# Patient Record
Sex: Male | Born: 1995 | Race: White | Hispanic: No | Marital: Single | Smoking: Current every day smoker
Health system: Southern US, Community
[De-identification: ages and names within clinical notes are randomized; demographics above are authoritative.]

## PROBLEM LIST (undated history)

## (undated) DIAGNOSIS — B1920 Unspecified viral hepatitis C without hepatic coma: Secondary | ICD-10-CM

## (undated) DIAGNOSIS — F3111 Bipolar disorder, current episode manic without psychotic features, mild: Secondary | ICD-10-CM

## (undated) DIAGNOSIS — J45909 Unspecified asthma, uncomplicated: Secondary | ICD-10-CM

## (undated) HISTORY — PX: TONSILLECTOMY AND ADENOIDECTOMY: SUR1326

## (undated) HISTORY — DX: Bipolar disorder, current episode manic without psychotic features, mild: F31.11

## (undated) HISTORY — DX: Unspecified viral hepatitis C without hepatic coma: B19.20

---

## 2018-12-31 ENCOUNTER — Emergency Department (HOSPITAL_COMMUNITY)
Admission: EM | Admit: 2018-12-31 | Discharge: 2018-12-31 | Disposition: A | Discharge status: Home / Self Care | Payer: Self-pay | Attending: Emergency Medicine | Admitting: Emergency Medicine

## 2018-12-31 ENCOUNTER — Other Ambulatory Visit: Payer: Self-pay

## 2018-12-31 ENCOUNTER — Encounter (HOSPITAL_COMMUNITY): Payer: Self-pay

## 2018-12-31 DIAGNOSIS — J45901 Unspecified asthma with (acute) exacerbation: Secondary | ICD-10-CM | POA: Insufficient documentation

## 2018-12-31 HISTORY — DX: Unspecified asthma, uncomplicated: J45.909

## 2018-12-31 MED ORDER — AEROCHAMBER Z-STAT PLUS/MEDIUM MISC
1.0000 | Freq: Once | Status: DC
  Filled 2018-12-31: qty 1

## 2018-12-31 MED ORDER — METHYLPREDNISOLONE SODIUM SUCC 125 MG IJ SOLR
125.0000 mg | Freq: Once | INTRAMUSCULAR | Status: AC
  Administered 2018-12-31: 125 mg via INTRAVENOUS
  Filled 2018-12-31: qty 2

## 2018-12-31 MED ORDER — MAGNESIUM SULFATE 2 GM/50ML IV SOLN
2.0000 g | INTRAVENOUS | Status: AC
  Administered 2018-12-31: 06:00:00 2 g via INTRAVENOUS
  Filled 2018-12-31: qty 50

## 2018-12-31 MED ORDER — PREDNISONE 20 MG PO TABS: 40.0000 mg | ORAL_TABLET | Freq: Every day | ORAL | 0 refills | Status: DC

## 2018-12-31 MED ORDER — ALBUTEROL SULFATE HFA 108 (90 BASE) MCG/ACT IN AERS
8.0000 | INHALATION_SPRAY | Freq: Once | RESPIRATORY_TRACT | Status: AC
  Administered 2018-12-31: 8 via RESPIRATORY_TRACT
  Filled 2018-12-31: qty 6.7

## 2018-12-31 MED ORDER — ALBUTEROL SULFATE HFA 108 (90 BASE) MCG/ACT IN AERS
8.0000 | INHALATION_SPRAY | Freq: Once | RESPIRATORY_TRACT | Status: AC
  Administered 2018-12-31: 8 via RESPIRATORY_TRACT

## 2018-12-31 NOTE — ED Triage Notes (Signed)
Pt coming in c/o onset of shortness of breath after being around cats tonight. Pt is allergic. Hx of asthma. Used inhaler multiple times without relief.

## 2018-12-31 NOTE — ED Notes (Signed)
Pt ambulated roughly 200 ft with out assistance. Pt O2 saturation remained above 95% with out any complaint or noticeable distress from pt.

## 2018-12-31 NOTE — ED Provider Notes (Signed)
[place] DEPT Provider Note   CSN: [id number] Arrival date & time: 12/31/18  0200     History Chief Complaint  Patient presents with  . Asthma    Carlos Schmidt is a 23 y.o. male.  Patient presents to the emergency department with a chief complaint of asthma exacerbation.  He states that he has a history of asthma.  He was exposed to a cat today, which he is very allergic to.  He denies any fevers, chills, cough.  He has tried using his inhaler with little relief.  Denies any other associated symptoms.  The history is provided by the patient. No language interpreter was used.       Past Medical History:  Diagnosis Date  . Asthma     There are no problems to display for this patient.   History reviewed. No pertinent surgical history.     No family history on file.  Social History   Tobacco Use  . Smoking status: Not on file  Substance Use Topics  . Alcohol use: Not on file  . Drug use: Not on file    Home Medications Prior to Admission medications   Not on File    Allergies    Patient has no allergy information on record.  Review of Systems   Review of Systems  All other systems reviewed and are negative.   Physical Exam Updated Vital Signs BP (!) 155/107 (BP Location: Right Arm)   Pulse (!) 108   Temp 98.9 F (37.2 C) (Oral)   Resp 19   Ht 5\' 10"  (1.778 m)   Wt 95.3 kg   SpO2 96%   BMI 30.13 kg/m   Physical Exam Vitals and nursing note reviewed.  Constitutional:      Appearance: He is well-developed.  HENT:     Head: Normocephalic and atraumatic.  Eyes:     Conjunctiva/sclera: Conjunctivae normal.  Cardiovascular:     Rate and Rhythm: Normal rate and regular rhythm.     Heart sounds: No murmur.  Pulmonary:     Effort: Pulmonary effort is normal. No respiratory distress.     Breath sounds: Wheezing present.  Abdominal:     Palpations: Abdomen is soft.     Tenderness: There is no abdominal  tenderness.  Musculoskeletal:        General: Normal range of motion.     Cervical back: Neck supple.  Skin:    General: Skin is warm and dry.  Neurological:     Mental Status: He is alert and oriented to person, place, and time.  Psychiatric:        Mood and Affect: Mood normal.        Behavior: Behavior normal.     ED Results / Procedures / Treatments   Labs (all labs ordered are listed, but only abnormal results are displayed) Labs Reviewed - No data to display  EKG None  Radiology No results found.  Procedures .Critical Care Performed by: Montine Circle, PA-C Authorized by: Montine Circle, PA-C   Critical care provider statement:    Critical care time (minutes):  40   Critical care was necessary to treat or prevent imminent or life-threatening deterioration of the following conditions: asthma exacerbation, multiple rechecks, multiple rounds of albuterol, IV mag, IV solumedrol.   Critical care was time spent personally by me on the following activities:  Discussions with consultants, evaluation of patient's response to treatment, examination of patient, ordering and performing treatments and  interventions, ordering and review of laboratory studies, ordering and review of radiographic studies, pulse oximetry, re-evaluation of patient's condition, obtaining history from patient or surrogate and review of old charts   (including critical care time)  Medications Ordered in ED Medications  methylPREDNISolone sodium succinate (SOLU-MEDROL) 125 mg/2 mL injection 125 mg (has no administration in time range)  albuterol (VENTOLIN HFA) 108 (90 Base) MCG/ACT inhaler 8 puff (has no administration in time range)  AeroChamber Plus Flo-Vu Large MISC 1 each (has no administration in time range)    ED Course  I have reviewed the triage vital signs and the nursing notes.  Pertinent labs & imaging results that were available during my care of the patient were reviewed by me and  considered in my medical decision making (see chart for details).    MDM Rules/Calculators/A&P                      Patient with asthma exacerbation from a cat allergy.  Wheezy on exam.  Will give 8 puffs of albuterol and 125mg  IV solumedrol and reassess.  Still wheezy after albuterol, will reorder.  After 2nd round of 8 puffs of albuterol, the patient remains wheezy, but is feeling better.  He is not in distress.  Will give some mag and another round of albuterol.  Patient has received several albuterol inhaler puffs in the emergency department.  He has been given Solu-Medrol and magnesium.  He is feeling improved, but does still have some mild wheezing.  He ambulates maintaining O2 sat above 95% without any distress.  I don't think that he needs any further treatments here.  I believe that he is safe for discharge to home.   Final Clinical Impression(s) / ED Diagnoses Final diagnoses:  Moderate asthma with exacerbation, unspecified whether persistent    Rx / DC Orders ED Discharge Orders    None       , PA-C 12/31/18 0606    Mesner, 01/02/19, MD 12/31/18 0700

## 2019-03-03 ENCOUNTER — Emergency Department (HOSPITAL_COMMUNITY)
Admission: EM | Admit: 2019-03-03 | Discharge: 2019-03-04 | Disposition: A | Discharge status: Home / Self Care | Payer: BLUE CROSS/BLUE SHIELD | Attending: Emergency Medicine | Admitting: Emergency Medicine

## 2019-03-03 ENCOUNTER — Other Ambulatory Visit: Payer: Self-pay

## 2019-03-03 ENCOUNTER — Encounter (HOSPITAL_COMMUNITY): Payer: Self-pay | Admitting: Emergency Medicine

## 2019-03-03 ENCOUNTER — Emergency Department (HOSPITAL_COMMUNITY): Payer: BLUE CROSS/BLUE SHIELD

## 2019-03-03 DIAGNOSIS — E876 Hypokalemia: Secondary | ICD-10-CM | POA: Insufficient documentation

## 2019-03-03 DIAGNOSIS — F17210 Nicotine dependence, cigarettes, uncomplicated: Secondary | ICD-10-CM | POA: Diagnosis not present

## 2019-03-03 DIAGNOSIS — F1990 Other psychoactive substance use, unspecified, uncomplicated: Secondary | ICD-10-CM

## 2019-03-03 DIAGNOSIS — R0789 Other chest pain: Secondary | ICD-10-CM | POA: Diagnosis present

## 2019-03-03 DIAGNOSIS — J45909 Unspecified asthma, uncomplicated: Secondary | ICD-10-CM | POA: Insufficient documentation

## 2019-03-03 DIAGNOSIS — F1910 Other psychoactive substance abuse, uncomplicated: Secondary | ICD-10-CM | POA: Diagnosis not present

## 2019-03-03 LAB — CBC
HCT: 51 % (ref 39.0–52.0)
Hemoglobin: 17.6 g/dL — ABNORMAL HIGH (ref 13.0–17.0)
MCH: 29.7 pg (ref 26.0–34.0)
MCHC: 34.5 g/dL (ref 30.0–36.0)
MCV: 86.1 fL (ref 80.0–100.0)
Platelets: 246 10*3/uL (ref 150–400)
RBC: 5.92 MIL/uL — ABNORMAL HIGH (ref 4.22–5.81)
RDW: 12.2 % (ref 11.5–15.5)
WBC: 9.6 10*3/uL (ref 4.0–10.5)
nRBC: 0 % (ref 0.0–0.2)

## 2019-03-03 LAB — BASIC METABOLIC PANEL
Anion gap: 13 (ref 5–15)
BUN: 10 mg/dL (ref 6–20)
CO2: 22 mmol/L (ref 22–32)
Calcium: 9.7 mg/dL (ref 8.9–10.3)
Chloride: 105 mmol/L (ref 98–111)
Creatinine, Ser: 0.97 mg/dL (ref 0.61–1.24)
GFR calc Af Amer: >60 mL/min (ref >60)
GFR calc non Af Amer: >60 mL/min (ref >60)
Glucose, Bld: 113 mg/dL — ABNORMAL HIGH (ref 70–99)
Potassium: 3 mmol/L — ABNORMAL LOW (ref 3.5–5.1)
Sodium: 140 mmol/L (ref 135–145)

## 2019-03-03 LAB — TROPONIN I (HIGH SENSITIVITY)
Troponin I (High Sensitivity): <2 ng/L (ref <18)
Troponin I (High Sensitivity): <2 ng/L (ref <18)

## 2019-03-03 LAB — PROTIME-INR
INR: 1.1 (ref 0.8–1.2)
Prothrombin Time: 13.9 seconds (ref 11.4–15.2)

## 2019-03-03 MED ORDER — SODIUM CHLORIDE 0.9% FLUSH: 3.0000 mL | Freq: Once | INTRAVENOUS | Status: DC

## 2019-03-03 NOTE — ED Triage Notes (Signed)
Patient reports central chest pain /palpitations with mild SOB , anxiety and mild dizziness this evening , denies emesis or diaphoresis . No cough or fever .

## 2019-03-04 MED ORDER — POTASSIUM CHLORIDE CRYS ER 20 MEQ PO TBCR
40.0000 meq | EXTENDED_RELEASE_TABLET | Freq: Once | ORAL | Status: AC
  Administered 2019-03-04: 40 meq via ORAL
  Filled 2019-03-04: qty 2

## 2019-03-04 MED ORDER — ALBUTEROL SULFATE HFA 108 (90 BASE) MCG/ACT IN AERS
4.0000 | INHALATION_SPRAY | Freq: Once | RESPIRATORY_TRACT | Status: AC
  Administered 2019-03-04: 4 via RESPIRATORY_TRACT
  Filled 2019-03-04: qty 6.7

## 2019-03-04 NOTE — ED Notes (Signed)
Patient verbalizes understanding of discharge instructions. Opportunity for questioning and answers were provided. Armband removed by staff, pt discharged from ED. Pt. ambulatory and discharged home.  

## 2019-03-04 NOTE — ED Notes (Signed)
Pt states he was at a social gathering and to took two pills. He thought they were adderal but said these pills looked different. He also said he has been drinking. Pt states he had 2-3 mixed drinks). He decided to smoke a blunt to calm down. After that, he was experiencing SHOB and worsening CP. Pt has a history of asthma, so he used his inhaler.

## 2019-03-04 NOTE — ED Provider Notes (Signed)
[facility] EMERGENCY DEPARTMENT Provider Note  CSN: [id number] Arrival date & time: 03/03/19 2005  Chief Complaint(s) Chest Pain  HPI Carlos Schmidt is a 24 y.o. male with a history of asthma who presents to the emergency department with left-sided chest pain and arm cramping.  Patient reports that he was out partying with friends, drinking alcohol, smoking marijuana and decided to take some Adderall.  Patient noted the symptoms just prior to 8 PM.  They have since resolved.  He denied any associated shortness of breath.  No recent fevers or infections.  No coughing or congestion.  No nausea or vomiting.  No abdominal pain.  No other physical complaints.  HPI  Past Medical History Past Medical History:  Diagnosis Date   Asthma    There are no problems to display for this patient.  Home Medication(s) Prior to Admission medications   Medication Sig Start Date End Date Taking? Authorizing Provider  predniSONE (DELTASONE) 20 MG tablet Take 2 tablets (40 mg total) by mouth daily. Patient not taking: Reported on 03/04/2019 12/31/18   Roxy Horseman, PA-C                                                                                                                                    Past Surgical History History reviewed. No pertinent surgical history. Family History No family history on file.  Social History Social History   Tobacco Use   Smoking status: Current Every Day Smoker   Smokeless tobacco: Never Used  Substance Use Topics   Alcohol use: Yes   Drug use: Yes    Types: Marijuana, Cocaine   Allergies Patient has no known allergies.  Review of Systems Review of Systems All other systems are reviewed and are negative for acute change except as noted in the HPI  Physical Exam Vital Signs  I have reviewed the triage vital signs BP (!) 157/106    Pulse (!) 103    Temp (!) 97.4 F (36.3 C) (Oral)    Resp 13    SpO2 98%   Physical  Exam Vitals reviewed.  Constitutional:      General: He is not in acute distress.    Appearance: He is well-developed. He is not diaphoretic.  HENT:     Head: Normocephalic and atraumatic.     Nose: Nose normal.  Eyes:     General: No scleral icterus.       Right eye: No discharge.        Left eye: No discharge.     Conjunctiva/sclera: Conjunctivae normal.     Pupils: Pupils are equal, round, and reactive to light.  Cardiovascular:     Rate and Rhythm: Normal rate and regular rhythm.     Heart sounds: No murmur. No friction rub. No gallop.   Pulmonary:     Effort: Pulmonary effort is normal. No respiratory distress.  Breath sounds: Normal breath sounds. No stridor. No rales.  Chest:     Chest wall: Tenderness present.    Abdominal:     General: There is no distension.     Palpations: Abdomen is soft.     Tenderness: There is no abdominal tenderness.  Musculoskeletal:        General: No tenderness.     Cervical back: Normal range of motion and neck supple.  Skin:    General: Skin is warm and dry.     Findings: No erythema or rash.  Neurological:     Mental Status: He is alert and oriented to person, place, and time.     ED Results and Treatments Labs (all labs ordered are listed, but only abnormal results are displayed) Labs Reviewed  BASIC METABOLIC PANEL - Abnormal; Notable for the following components:      Result Value   Potassium 3.0 (*)    Glucose, Bld 113 (*)    All other components within normal limits  CBC - Abnormal; Notable for the following components:   RBC 5.92 (*)    Hemoglobin 17.6 (*)    All other components within normal limits  PROTIME-INR  TROPONIN I (HIGH SENSITIVITY)  TROPONIN I (HIGH SENSITIVITY)                                                                                                                         EKG  EKG Interpretation  Date/Time:  Wednesday March 03 2019 20:09:01 EST Ventricular Rate:  127 PR  Interval:  128 QRS Duration: 84 QT Interval:  314 QTC Calculation: 456 R Axis:   81 Text Interpretation: Sinus tachycardia Otherwise normal ECG Confirmed by Drema Pry [phone]) on 03/04/2019 1:43:19 AM      Radiology DG Chest 2 View  Result Date: 03/03/2019 CLINICAL DATA:  Chest pain EXAM: CHEST - 2 VIEW COMPARISON:  None. FINDINGS: The heart size and mediastinal contours are within normal limits. Both lungs are clear. The visualized skeletal structures are unremarkable. IMPRESSION: No active cardiopulmonary disease. Electronically Signed   By: Jasmine Pang M.D.   On: 03/03/2019 20:46    Pertinent labs & imaging results that were available during my care of the patient were reviewed by me and considered in my medical decision making (see chart for details).  Medications Ordered in ED Medications  sodium chloride flush (NS) 0.9 % injection 3 mL (has no administration in time range)  potassium chloride SA (KLOR-CON) CR tablet 40 mEq (40 mEq Oral Given 03/04/19 0209)  albuterol (VENTOLIN HFA) 108 (90 Base) MCG/ACT inhaler 4 puff (4 puffs Inhalation Given 03/04/19 0319)  Procedures Procedures  (including critical care time)  Medical Decision Making / ED Course I have reviewed the nursing notes for this encounter and the patient's prior records (if available in EHR or on provided paperwork).   Carlos Schmidt was evaluated in Emergency Department on 03/04/2019 for the symptoms described in the history of present illness. He was evaluated in the context of the global COVID-19 pandemic, which necessitated consideration that the patient might be at risk for infection with the SARS-CoV-2 virus that causes COVID-19. Institutional protocols and algorithms that pertain to the evaluation of patients at risk for COVID-19 are in a state of rapid change based on  information released by regulatory bodies including the CDC and federal and state organizations. These policies and algorithms were followed during the patient's care in the ED.  Atypical chest pain highly inconsistent with ACS. EKG without acute ischemic changes or evidence of pericarditis. Labs drawn in triage revealed negative serial troponins.   Patient did have mild hypokalemia which may have contributed to his reported spasms. Low suspicion for pulmonary embolism.  Presentation not classic for aortic dissection or esophageal perforation. Chest x-ray without evidence suggestive of pneumonia, pneumothorax, pneumomediastinum.  No abnormal contour of the mediastinum to suggest dissection. No evidence of acute injuries.        Final Clinical Impression(s) / ED Diagnoses Final diagnoses:  Problem associated with substance use  Hypokalemia   The patient appears reasonably screened and/or stabilized for discharge and I doubt any other medical condition or other [facility] requiring further screening, evaluation, or treatment in the ED at this time prior to discharge. Safe for discharge with strict return precautions.  Disposition: Discharge  Condition: Good  I have discussed the results, Dx and Tx plan with the patient/family who expressed understanding and agree(s) with the plan. Discharge instructions discussed at length. The patient/family was given strict return precautions who verbalized understanding of the instructions. No further questions at time of discharge.    ED Discharge Orders    None        Follow Up: Primary care provider  Schedule an appointment as soon as possible for a visit  If you do not have a primary care physician, contact [facility] at [phone] for referral     This chart was dictated using voice recognition software.  Despite best efforts to proofread,  errors can occur which can change the documentation meaning.   Fatima Blank,  MD 03/04/19 [phone]

## 2019-05-10 ENCOUNTER — Telehealth: Payer: Self-pay | Admitting: Pharmacy Technician

## 2019-05-10 NOTE — Telephone Encounter (Signed)
[facility] Patient Advocate Encounter ° °Insurance verification completed.   ° °The patient is uninsured and will need patient assistance for medication. ° °We can complete the application and will need to meet with the patient for signatures and income documentation. ° °Barth Trella E. Derrell Milanes, CPhT °Specialty Pharmacy Patient Advocate °[facility] °Phone: [phone] °Fax:  [phone] ° ° °

## 2019-05-11 ENCOUNTER — Encounter: Payer: BLUE CROSS/BLUE SHIELD | Admitting: Family

## 2019-06-03 ENCOUNTER — Ambulatory Visit (INDEPENDENT_AMBULATORY_CARE_PROVIDER_SITE_OTHER): Payer: BLUE CROSS/BLUE SHIELD | Admitting: Family

## 2019-06-03 ENCOUNTER — Telehealth: Payer: Self-pay | Admitting: Pharmacy Technician

## 2019-06-03 ENCOUNTER — Encounter: Payer: Self-pay | Admitting: Family

## 2019-06-03 ENCOUNTER — Other Ambulatory Visit: Payer: Self-pay

## 2019-06-03 VITALS — BP 141/94 | HR 82 | Temp 97.6°F | Ht 71.0 in | Wt 238.0 lb

## 2019-06-03 DIAGNOSIS — B182 Chronic viral hepatitis C: Secondary | ICD-10-CM | POA: Diagnosis not present

## 2019-06-03 NOTE — Progress Notes (Signed)
Subjective:    Patient ID: Carlos Schmidt, male    DOB: [birth year], 24 y.o.   MRN: [id number]  Chief Complaint  Patient presents with  . Hepatitis C    HPI:  Carlos Schmidt is a 24 y.o. male with previous history of IV drug use and bipolar disorder presenting today at the request of the health department for initial treatment and evaluation for hepatitis C.  [name] was initially diagnosed with hepatitis C about 3 years ago when he was admitted for rehabilitation for IV drug use.  He does have a tattoo that he received from a reputable source.  Denies any history of blood transfusions prior to 1992, sharing of razors/toothbrushes, or sexual contact with known positive partner.  No treatment since diagnosis.  Currently asymptomatic with no abdominal pain, nausea, vomiting, scleral icterus, or jaundice.  No personal history of liver disease.  His mother had liver cancer but also noted to have metastatic breast cancer.  Has not used any cocaine in the last 3 to 4 months and has not used marijuana in the last month.  Currently clean from recreational or illicit drugs and alcohol.  He does smoke tobacco regularly.  Previous lab work reviewed with hepatitis B negative.                                                          No Known Allergies    Outpatient Medications Prior to Visit  Medication Sig Dispense Refill  . divalproex (DEPAKOTE) 125 MG DR tablet Take 125 mg by mouth at bedtime.    . predniSONE (DELTASONE) 20 MG tablet Take 2 tablets (40 mg total) by mouth daily. (Patient not taking: Reported on 03/04/2019) 10 tablet 0   No facility-administered medications prior to visit.     Past Medical History:  Diagnosis Date  . Asthma   . Bipolar 1 disorder, manic, mild ([place])   . Hepatitis C       Past Surgical History:  Procedure Laterality Date  . TONSILLECTOMY AND ADENOIDECTOMY        History reviewed. No pertinent family history.    Social History    Socioeconomic History  . Marital status: Single    Spouse name: Not on file  . Number of children: Not on file  . Years of education: Not on file  . Highest education level: Not on file  Occupational History  . Occupation: Powerwashing   Tobacco Use  . Smoking status: Current Every Day Smoker    Packs/day: 0.50    Years: 11.00    Pack years: 5.50    Types: Cigarettes  . Smokeless tobacco: Never Used  Substance and Sexual Activity  . Alcohol use: Not Currently  . Drug use: Not Currently    Types: Marijuana, Cocaine    Comment: 3-4 months clean ; Marijuana for about 1 month  . Sexual activity: Not on file  Other Topics Concern  . Not on file  Social History Narrative  . Not on file   Social Determinants of Health   Financial Resource Strain:   . Difficulty of Paying Living Expenses:   Food Insecurity:   . Worried About Charity fundraiser in the Last Year:   . Arboriculturist in the Last Year:   [employer]  Needs:   . Lack of Transportation (Medical):   Marland Kitchen Lack of Transportation (Non-Medical):   Physical Activity:   . Days of Exercise per Week:   . Minutes of Exercise per Session:   Stress:   . Feeling of Stress :   Social Connections:   . Frequency of Communication with Friends and Family:   . Frequency of Social Gatherings with Friends and Family:   . Attends Religious Services:   . Active Member of Clubs or Organizations:   . Attends Archivist Meetings:   Marland Kitchen Marital Status:   Intimate Partner Violence:   . Fear of Current or Ex-Partner:   . Emotionally Abused:   Marland Kitchen Physically Abused:   . Sexually Abused:       Review of Systems  Constitutional: Negative for chills, diaphoresis, fatigue and fever.  Respiratory: Negative for cough, chest tightness, shortness of breath and wheezing.   Cardiovascular: Negative for chest pain.  Gastrointestinal: Negative for abdominal distention, abdominal pain, constipation, diarrhea, nausea and vomiting.   Neurological: Negative for weakness and headaches.  Hematological: Does not bruise/bleed easily.       Objective:    BP (!) 141/94   Pulse 82   Temp 97.6 F (36.4 C)   Ht '5\' 11"'$  (1.803 m)   Wt 238 lb (108 kg)   SpO2 96%   BMI 33.19 kg/m  Nursing note and vital signs reviewed.  Physical Exam Constitutional:      General: He is not in acute distress.    Appearance: He is well-developed.  Cardiovascular:     Rate and Rhythm: Normal rate and regular rhythm.     Heart sounds: Normal heart sounds. No murmur. No friction rub. No gallop.   Pulmonary:     Effort: Pulmonary effort is normal. No respiratory distress.     Breath sounds: Normal breath sounds. No wheezing or rales.  Chest:     Chest wall: No tenderness.  Abdominal:     General: Bowel sounds are normal. There is no distension.     Palpations: Abdomen is soft. There is no mass.     Tenderness: There is no abdominal tenderness. There is no guarding or rebound.  Skin:    General: Skin is warm and dry.  Neurological:     Mental Status: He is alert and oriented to person, place, and time.  Psychiatric:        Behavior: Behavior normal.        Thought Content: Thought content normal.        Judgment: Judgment normal.         Assessment & Plan:   Patient Active Problem List   Diagnosis Date Noted  . Chronic hepatitis C without hepatic coma ([place]) 06/03/2019     Problem List Items Addressed This Visit      Digestive   Chronic hepatitis C without hepatic coma ([place]) - Primary    [name] is a 24 year old male with chronic hepatitis C likely obtained from IV drug use.  Asymptomatic and treatment nave.  Discussed the pathogenesis, transmission, prevention, risks if left untreated, and treatment options for hepatitis C.  Previous lab work shows negative hepatitis B.  Check blood work today including genotype, RNA level, HIV status, hepatic function status and fibrosis status.  He met with pharmacy staff to complete  financial assistance.  Plan for treatment pending blood work results      Relevant Orders   CBC   Hepatic function panel  Hepatitis C RNA quantitative   Liver Fibrosis, FibroTest-ActiTest   Protime-INR   Hepatitis C genotype   HIV antibody (with reflex)       I have discontinued [place]'s predniSONE. I am also having him maintain his divalproex.    Follow-up: 1 month after initiation of medication.    Terri Piedra, MSN, FNP-C Nurse Practitioner [facility] [place] number: [phone]

## 2019-06-03 NOTE — Patient Instructions (Signed)
Nice to see you. ? ?We will check your lab work today and let you know the results. ? ?Limit acetaminophen (Tylenol) usage to no more than 2 grams (2,000 mg) per day. ? ?Avoid alcohol. ? ?Do not share toothbrushes or razors. ? ?Practice safe sex to protect against transmission as well as sexually transmitted disease.  ? ? ?Hepatitis C ?Hepatitis C is a viral infection of the liver. It can lead to scarring of the liver (cirrhosis), liver failure, or liver cancer. Hepatitis C may go undetected for months or years because people with the infection may not have symptoms, or they may have only mild symptoms. ?What are the causes? ?This condition is caused by the hepatitis C virus (HCV). The virus can spread from person to person (is contagious) through: ?Blood. ?Childbirth. A woman who has hepatitis C can pass it to her baby during birth. ?Bodily fluids, such as breast milk, tears, semen, vaginal fluids, and saliva. ?Blood transfusions or organ transplants done in the [place] before 1992. ? ?What increases the risk? ?The following factors may make you more likely to develop this condition: ?Having contact with unclean (contaminated) needles or syringes. This may result from: ?Acupuncture. ?Tattoing. ?Body piercing. ?Injecting drugs. ?Having unprotected sex with someone who is infected. ?Needing treatment to filter your blood (kidney dialysis). ?Having HIV (human immunodeficiency virus) or AIDS (acquired immunodeficiency syndrome). ?Working in a job that involves contact with blood or bodily fluids, such as health care. ? ?What are the signs or symptoms? ?Symptoms of this condition include: ?Fatigue. ?Loss of appetite. ?Nausea. ?Vomiting. ?Abdominal pain. ?Dark yellow urine. ?Yellowish skin and eyes (jaundice). ?Itchy skin. ?Clay-colored bowel movements. ?Joint pain. ?Bleeding and bruising easily. ?Fluid building up in your stomach (ascites). ? ?In some cases, you may not have any symptoms. ?How is this  diagnosed? ?This condition is diagnosed with: ?Blood tests. ?Other tests to check how well your liver is functioning. They may include: ?Magnetic resonance elastography (MRE). This imaging test uses MRIs and sound waves to measure liver stiffness. ?Transient elastography. This imaging test uses ultrasounds to measure liver stiffness. ?Liver biopsy. This test requires taking a small tissue sample from your liver to examine it under a microscope. ? ?How is this treated? ?Your health care provider may perform noninvasive tests or a liver biopsy to help decide the best course of treatment. Treatment may include: ?Antiviral medicines and other medicines. ?Follow-up treatments every 6-12 months for infections or other liver conditions. ?Receiving a donated liver (liver transplant). ? ?Follow these instructions at home: ?Medicines ?Take over-the-counter and prescription medicines only as told by your health care provider. ?Take your antiviral medicine as told by your health care provider. Do not stop taking the antiviral even if you start to feel better. ?Do not take any medicines unless approved by your health care provider, including over-the-counter medicines and birth control pills. ?Activity ?Rest as needed. ?Do not have sex unless approved by your health care provider. ?Ask your health care provider when you may return to school or work. ?Eating and drinking ?Eat a balanced diet with plenty of fruits and vegetables, whole grains, and lowfat (lean) meats or non-meat proteins (such as beans or tofu). ?Drink enough fluids to keep your urine clear or pale yellow. ?Do not drink alcohol. ?General instructions ?Do not share toothbrushes, nail clippers, or razors. ?Wash your hands frequently with soap and water. If soap and water are not available, use hand sanitizer. ?Cover any cuts or open sores on your skin to   prevent spreading the virus. ?Keep all follow-up visits as told by your health care provider. This is important.  You may need follow-up visits every 6-12 months. ?How is this prevented? ?There is no vaccine for hepatitis C. The only way to prevent the disease is to reduce the risk of exposure to the virus. Make sure you: ?Wash your hands frequently with soap and water. If soap and water are not available, use hand sanitizer. ?Do not share needles or syringes. ?Practice safe sex and use condoms. ?Avoid handling blood or bodily fluids without gloves or other protection. ?Avoid getting tattoos or piercings in shops or other locations that are not clean. ? ?Contact a health care provider if: ?You have a fever. ?You develop abdominal pain. ?You pass dark urine. ?You pass clay-colored stools. ?You develop joint pain. ?Get help right away if: ?You have increasing fatigue or weakness. ?You lose your appetite. ?You cannot eat or drink without vomiting. ?You develop jaundice or your jaundice gets worse. ?You bruise or bleed easily. ?Summary ?Hepatitis C is a viral infection of the liver. It can lead to scarring of the liver (cirrhosis), liver failure, or liver cancer. ?The hepatitis C virus (HCV) causes this condition. The virus can pass from person to person (is contagious). ?You should not take any medicines unless approved by your health care provider. This includes over-the-counter medicines and birth control pills. ?This information is not intended to replace advice given to you by your health care provider. Make sure you discuss any questions you have with your health care provider. ?Document Released: 12/29/1999 Document Revised: 02/06/2016 Document Reviewed: 02/06/2016 ?Elsevier Interactive Patient Education ? 2018 Elsevier Inc. ? ?

## 2019-06-03 NOTE — Telephone Encounter (Addendum)
[facility] Patient Advocate Encounter    Findings of the benefits investigation:   Insurance: no coverage found. Patient says he has BCBS pharmacy coverage under his father's plan  [facility] said that the patient last filled with them using [place] match program and discharge to fellowship Margo Aye  [facility] Patient Advocate followed-up at the patient's appointment and his card was only medical. I had him signed the patient assistance forms just in case. He said he fills at [address] but I was unable to get anyone on the phone to provide this information.   Beulah Gandy, CPhT Specialty Pharmacy Patient [facility] for Infectious Disease Phone: [phone] Fax: [phone] 06/03/2019 8:50 AM

## 2019-06-03 NOTE — Assessment & Plan Note (Signed)
Mr. 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  Asymptomatic and treatment nave.  Discussed the pathogenesis, transmission, prevention, risks if left untreated, and treatment options for hepatitis C.  Previous lab work shows negative hepatitis B.  Check blood work today including genotype, RNA level, HIV status, hepatic function status and fibrosis status.  He met with pharmacy staff to complete financial assistance.  Plan for treatment pending blood work results

## 2019-06-18 LAB — HEPATIC FUNCTION PANEL
AG Ratio: 1.8 (calc) (ref 1.0–2.5)
ALT: 142 U/L — ABNORMAL HIGH (ref 9–46)
AST: 57 U/L — ABNORMAL HIGH (ref 10–40)
Albumin: 4.8 g/dL (ref 3.6–5.1)
Alkaline phosphatase (APISO): 69 U/L (ref 36–130)
Bilirubin, Direct: 0.1 mg/dL (ref 0.0–0.2)
Globulin: 2.7 g/dL (calc) (ref 1.9–3.7)
Indirect Bilirubin: 0.7 mg/dL (calc) (ref 0.2–1.2)
Total Bilirubin: 0.8 mg/dL (ref 0.2–1.2)
Total Protein: 7.5 g/dL (ref 6.1–8.1)

## 2019-06-18 LAB — LIVER FIBROSIS, FIBROTEST-ACTITEST
ALT: 134 U/L — ABNORMAL HIGH (ref 9–46)
Alpha-2-Macroglobulin: 146 mg/dL (ref 106–279)
Apolipoprotein A1: 117 mg/dL (ref 94–176)
Bilirubin: 0.8 mg/dL (ref 0.2–1.2)
Fibrosis Score: 0.18
GGT: 57 U/L (ref 3–70)
Haptoglobin: 116 mg/dL (ref 43–212)
Necroinflammat ACT Score: 0.64
Reference ID: 3415571

## 2019-06-18 LAB — CBC
HCT: 51.3 % — ABNORMAL HIGH (ref 38.5–50.0)
Hemoglobin: 17.6 g/dL — ABNORMAL HIGH (ref 13.2–17.1)
MCH: 30.1 pg (ref 27.0–33.0)
MCHC: 34.3 g/dL (ref 32.0–36.0)
MCV: 87.8 fL (ref 80.0–100.0)
MPV: 11.6 fL (ref 7.5–12.5)
Platelets: 183 10*3/uL (ref 140–400)
RBC: 5.84 10*6/uL — ABNORMAL HIGH (ref 4.20–5.80)
RDW: 13.1 % (ref 11.0–15.0)
WBC: 6.2 10*3/uL (ref 3.8–10.8)

## 2019-06-18 LAB — HEPATITIS C RNA QUANTITATIVE
HCV Quantitative Log: 5.11 Log IU/mL — ABNORMAL HIGH
HCV RNA, PCR, QN: 129000 IU/mL — ABNORMAL HIGH

## 2019-06-18 LAB — PROTIME-INR
INR: 1
Prothrombin Time: 10.8 s (ref 9.0–11.5)

## 2019-06-18 LAB — HIV ANTIBODY (ROUTINE TESTING W REFLEX): HIV 1&2 Ab, 4th Generation: NONREACTIVE

## 2019-06-18 LAB — HEPATITIS C GENOTYPE: HCV Genotype: 3

## 2019-07-29 ENCOUNTER — Telehealth: Payer: Self-pay | Admitting: Pharmacy Technician

## 2019-07-29 ENCOUNTER — Other Ambulatory Visit: Payer: Self-pay | Admitting: Family

## 2019-07-29 MED ORDER — MAVYRET 100-40 MG PO TABS: 3.0000 | ORAL_TABLET | Freq: Every day | ORAL | 1 refills | Status: AC

## 2019-07-29 NOTE — Telephone Encounter (Addendum)
[facility] Patient Advocate Encounter  Completed and sent [employer] application for Mavyret for this patient who is uninsured.    Status is APPROVED.  We will continue to follow.  Once approved, he prefers picking medication up from the clinic.  Netty Starring. Dimas Aguas CPhT Specialty Pharmacy Patient [facility] for Infectious Disease Phone: [phone] Fax:  [phone]

## 2019-08-05 ENCOUNTER — Telehealth: Payer: Self-pay | Admitting: Pharmacy Technician

## 2019-08-05 NOTE — Telephone Encounter (Signed)
[facility] Patient Advocate Encounter  Patient's Carlos Schmidt will be shipped to the clinic by the manufacturer, Thursday August 12, 2019.  Netty Starring. Dimas Aguas CPhT Specialty Pharmacy Patient [facility] for Infectious Disease Phone: [phone] Fax:  [phone]

## 2019-08-23 ENCOUNTER — Telehealth: Payer: Self-pay | Admitting: Pharmacy Technician

## 2019-08-23 NOTE — Telephone Encounter (Signed)
He will pick up Mavyret tomorrow on break from work.  He could not give a time, but knows clinic hours and lunch break times.  Netty Starring. Dimas Aguas CPhT Specialty Pharmacy Patient [facility] for Infectious Disease Phone: [phone] Fax:  [phone]

## 2019-08-24 ENCOUNTER — Telehealth: Payer: Self-pay | Admitting: Pharmacy Technician

## 2019-08-24 NOTE — Telephone Encounter (Signed)
[facility] Patient Advocate Encounter  Patient's Mavyret will be shipped to the clinic August 24th 2021 from [facility] pharmacy.  The patient is notified and this is the final 4 weeks of treatment.  Netty Starring. Dimas Aguas CPhT Specialty Pharmacy Patient [facility] for Infectious Disease Phone: [phone] Fax:  [phone]

## 2019-09-08 ENCOUNTER — Encounter: Payer: Self-pay | Admitting: Pharmacy Technician

## 2019-09-08 ENCOUNTER — Telehealth: Payer: Self-pay | Admitting: Pharmacist

## 2019-09-08 ENCOUNTER — Telehealth: Payer: Self-pay | Admitting: Pharmacy Technician

## 2019-09-08 NOTE — Telephone Encounter (Signed)
Patient is approved to receive Mavyret x 8 weeks for chronic Hepatitis C infection. Counseled patient to take all three tablets of Mavyret daily with food.  Counseled patient the need to take all three tablets together and to not separate them out during the day. Encouraged patient not to miss any doses and explained how their chance of cure could go down with each dose missed. Counseled patient on what to do if dose is missed - if it is closer to the missed dose take immediately; if closer to next dose then skip dose and take the next dose at the usual time.   Counseled patient on common side effects such as headache, fatigue, and nausea and that these normally decrease with time. I reviewed patient medications and found no drug interactions. Discussed with patient that there are several drug interactions with Mavyret and instructed patient to call the clinic if he wishes to start a new medication during course of therapy. Also advised patient to call if he experiences any side effects. Patient will follow-up with me in the pharmacy clinic on 9/30.

## 2019-09-08 NOTE — Telephone Encounter (Signed)
[facility] Patient Advocate Encounter  Patient's medications have been shipped to [facility] and are ready for pick up.  Once he arrives, pharmacy part of clinic will hand him the medications and [facility] pharmacist will counsel.  Netty Starring. Dimas Aguas CPhT Specialty Pharmacy Patient [facility] for Infectious Disease Phone: [phone] Fax:  [phone]

## 2019-09-16 ENCOUNTER — Other Ambulatory Visit: Payer: Self-pay | Admitting: Pharmacist

## 2019-09-16 DIAGNOSIS — B182 Chronic viral hepatitis C: Secondary | ICD-10-CM

## 2019-10-14 ENCOUNTER — Other Ambulatory Visit: Payer: BLUE CROSS/BLUE SHIELD

## 2019-10-14 ENCOUNTER — Ambulatory Visit: Payer: BLUE CROSS/BLUE SHIELD | Admitting: Pharmacist

## 2020-08-04 ENCOUNTER — Encounter: Payer: Self-pay | Admitting: Emergency Medicine

## 2020-08-04 ENCOUNTER — Ambulatory Visit
Admission: EM | Admit: 2020-08-04 | Discharge: 2020-08-04 | Disposition: A | Discharge status: Home / Self Care | Payer: BLUE CROSS/BLUE SHIELD | Attending: Emergency Medicine | Admitting: Emergency Medicine

## 2020-08-04 ENCOUNTER — Other Ambulatory Visit: Payer: Self-pay

## 2020-08-04 DIAGNOSIS — Z760 Encounter for issue of repeat prescription: Secondary | ICD-10-CM | POA: Diagnosis not present

## 2020-08-04 DIAGNOSIS — R21 Rash and other nonspecific skin eruption: Secondary | ICD-10-CM | POA: Diagnosis not present

## 2020-08-04 DIAGNOSIS — J4520 Mild intermittent asthma, uncomplicated: Secondary | ICD-10-CM

## 2020-08-04 DIAGNOSIS — L239 Allergic contact dermatitis, unspecified cause: Secondary | ICD-10-CM | POA: Diagnosis not present

## 2020-08-04 MED ORDER — TRIAMCINOLONE ACETONIDE 0.1 % EX CREA: 1.0000 "application " | TOPICAL_CREAM | Freq: Two times a day (BID) | CUTANEOUS | 0 refills | Status: AC

## 2020-08-04 MED ORDER — PREDNISONE 10 MG PO TABS: ORAL_TABLET | ORAL | 0 refills | Status: DC

## 2020-08-04 MED ORDER — ALBUTEROL SULFATE HFA 108 (90 BASE) MCG/ACT IN AERS: 1.0000 | INHALATION_SPRAY | Freq: Four times a day (QID) | RESPIRATORY_TRACT | 0 refills | Status: DC | PRN

## 2020-08-04 NOTE — Discharge Instructions (Addendum)
Begin prednisone taper x6 days-begin with 6 tablets on day 1, decrease by 1 tablet each day until complete-6, 5, 4, 3, 2, 1-take with food and earlier in the day if possible Use antihistamines-daily cetirizine/Zyrtec, Claritin/Claritin, Benadryl in the evening Triamcinolone cream to areas still bothersome Return if not improving or worsening

## 2020-08-04 NOTE — ED Provider Notes (Signed)
[facility]    CSN: [id number] Arrival date & time: 08/04/20  1524      History   Chief Complaint Chief Complaint  Patient presents with   Rash    HPI Carlos Schmidt is a 25 y.o. male history of asthma, chronic hep C, bipolar type I, presenting today for evaluation of a rash.  Reports rash to bilateral forearms developing over the past couple days.  Associated with itching.  Denies any [place] any shortness of breath.  Denies involvement of face.  Denies history of similar.  Denies fevers, nausea vomiting, abdominal pain, arthralgias.  HPI  Past Medical History:  Diagnosis Date   Asthma    Bipolar 1 disorder, manic, mild (HCC)    Hepatitis C     Patient Active Problem List   Diagnosis Date Noted   Chronic hepatitis C without hepatic coma (HCC) 06/03/2019    Past Surgical History:  Procedure Laterality Date   TONSILLECTOMY AND ADENOIDECTOMY         Home Medications    Prior to Admission medications   Medication Sig Start Date End Date Taking? Authorizing Provider  albuterol (VENTOLIN HFA) 108 (90 Base) MCG/ACT inhaler Inhale 1-2 puffs into the lungs every 6 (six) hours as needed for wheezing or shortness of breath. 08/04/20  Yes Haron Beilke C, PA-C  predniSONE (DELTASONE) 10 MG tablet Begin with 6 tabs on day 1, 5 tab on day 2, 4 tab on day 3, 3 tab on day 4, 2 tab on day 5, 1 tab on day 6-take with food 08/04/20  Yes Albin Duckett C, PA-C  triamcinolone cream (KENALOG) 0.1 % Apply 1 application topically 2 (two) times daily. 08/04/20  Yes Taimur Fier C, PA-C  divalproex (DEPAKOTE) 125 MG DR tablet Take 125 mg by mouth at bedtime.    [provider]  Glecaprevir-Pibrentasvir (MAVYRET) 100-40 MG TABS Take 3 tablets by mouth daily. Take 3 tablets by mouth daily with a meal. 07/29/19   Veryl Speak, FNP    Family History No family history on file.  Social History Social History   Tobacco Use   Smoking status: Every Day     Packs/day: 0.50    Years: 11.00    Pack years: 5.50    Types: Cigarettes   Smokeless tobacco: Never  Vaping Use   Vaping Use: Every day  Substance Use Topics   Alcohol use: Not Currently   Drug use: Not Currently    Types: Marijuana, Cocaine    Comment: 3-4 months clean ; Marijuana for about 1 month     Allergies   Patient has no known allergies.   Review of Systems Review of Systems  Constitutional:  Negative for fatigue and fever.  Eyes:  Negative for redness, itching and visual disturbance.  Respiratory:  Negative for shortness of breath.   Cardiovascular:  Negative for chest pain and leg swelling.  Gastrointestinal:  Negative for nausea and vomiting.  Musculoskeletal:  Negative for arthralgias and myalgias.  Skin:  Positive for color change and rash. Negative for wound.  Neurological:  Negative for dizziness, syncope, weakness, light-headedness and headaches.    Physical Exam Triage Vital Signs ED Triage Vitals  Enc Vitals Group     BP      Pulse      Resp      Temp      Temp src      SpO2      Weight  Height      Head Circumference      Peak Flow      Pain Score      Pain Loc      Pain Edu?      Excl. in GC?    No data found.  Updated Vital Signs BP (!) 141/89 (BP Location: Right Arm)   Pulse 87   Temp 98.7 F (37.1 C) (Oral)   Resp 16   Wt 220 lb (99.8 kg)   SpO2 95%   BMI 30.68 kg/m   Visual Acuity Right Eye Distance:   Left Eye Distance:   Bilateral Distance:    Right Eye Near:   Left Eye Near:    Bilateral Near:     Physical Exam Vitals and nursing note reviewed.  Constitutional:      Appearance: He is well-developed.     Comments: No acute distress  HENT:     Head: Normocephalic and atraumatic.     Nose: Nose normal.  Eyes:     Conjunctiva/sclera: Conjunctivae normal.  Cardiovascular:     Rate and Rhythm: Normal rate.  Pulmonary:     Effort: Pulmonary effort is normal. No respiratory distress.  Abdominal:      General: There is no distension.  Musculoskeletal:        General: Normal range of motion.     Cervical back: Neck supple.  Skin:    General: Skin is warm and dry.     Comments: Erythematous maculopapular rash noted to bilateral forearms extending slightly onto upper arms, no involvement of palms or hands  Neurological:     Mental Status: He is alert and oriented to person, place, and time.     UC Treatments / Results  Labs (all labs ordered are listed, but only abnormal results are displayed) Labs Reviewed - No data to display  EKG   Radiology No results found.  Procedures Procedures (including critical care time)  Medications Ordered in UC Medications - No data to display  Initial Impression / Assessment and Plan / UC Course  I have reviewed the triage vital signs and the nursing notes.  Pertinent labs & imaging results that were available during my care of the patient were reviewed by me and considered in my medical decision making (see chart for details).     Rash most suggestive of a contact dermatitis-treating with prednisone taper x6 days along with use of antihistamines, triamcinolone topically still bothersome, lower suspicion of scabies, but may consider treatment if symptoms persistent.  No airway involvement or systemic symptoms.  Discussed strict return precautions. Patient verbalized understanding and is agreeable with plan.  Final Clinical Impressions(s) / UC Diagnoses   Final diagnoses:  Rash and nonspecific skin eruption  Allergic contact dermatitis, unspecified trigger  Mild intermittent asthma without complication  Medication refill     Discharge Instructions      Begin prednisone taper x6 days-begin with 6 tablets on day 1, decrease by 1 tablet each day until complete-6, 5, 4, 3, 2, 1-take with food and earlier in the day if possible Use antihistamines-daily cetirizine/Zyrtec, Claritin/Claritin, Benadryl in the evening Triamcinolone cream to  areas still bothersome Return if not improving or worsening     ED Prescriptions     Medication Sig Dispense Auth. Provider   predniSONE (DELTASONE) 10 MG tablet Begin with 6 tabs on day 1, 5 tab on day 2, 4 tab on day 3, 3 tab on day 4, 2 tab on day 5,  1 tab on day 6-take with food 21 tablet Marguarite Markov C, PA-C   triamcinolone cream (KENALOG) 0.1 % Apply 1 application topically 2 (two) times daily. 45 g Frankey Botting C, PA-C   albuterol (VENTOLIN HFA) 108 (90 Base) MCG/ACT inhaler Inhale 1-2 puffs into the lungs every 6 (six) hours as needed for wheezing or shortness of breath. 1 each Ugochi Henzler, Junius Creamer, PA-C      PDMP not reviewed this encounter.   Lew Dawes, [place] 08/07/20 [phone]

## 2020-08-04 NOTE — ED Triage Notes (Signed)
Rash for few days.

## 2020-08-07 ENCOUNTER — Telehealth (HOSPITAL_BASED_OUTPATIENT_CLINIC_OR_DEPARTMENT_OTHER): Payer: Self-pay

## 2020-08-11 NOTE — Telephone Encounter (Signed)
ERROR

## 2020-09-19 ENCOUNTER — Other Ambulatory Visit: Payer: Self-pay

## 2020-09-19 ENCOUNTER — Emergency Department (HOSPITAL_COMMUNITY)
Admission: EM | Admit: 2020-09-19 | Discharge: 2020-09-20 | Disposition: A | Discharge status: Home / Self Care | Payer: BLUE CROSS/BLUE SHIELD | Attending: Emergency Medicine | Admitting: Emergency Medicine

## 2020-09-19 ENCOUNTER — Emergency Department (HOSPITAL_COMMUNITY): Payer: BLUE CROSS/BLUE SHIELD

## 2020-09-19 DIAGNOSIS — R0602 Shortness of breath: Secondary | ICD-10-CM | POA: Diagnosis not present

## 2020-09-19 DIAGNOSIS — S6992XA Unspecified injury of left wrist, hand and finger(s), initial encounter: Secondary | ICD-10-CM | POA: Diagnosis present

## 2020-09-19 DIAGNOSIS — X58XXXA Exposure to other specified factors, initial encounter: Secondary | ICD-10-CM | POA: Diagnosis not present

## 2020-09-19 DIAGNOSIS — Z5321 Procedure and treatment not carried out due to patient leaving prior to being seen by health care provider: Secondary | ICD-10-CM | POA: Diagnosis not present

## 2020-09-19 DIAGNOSIS — S61412A Laceration without foreign body of left hand, initial encounter: Secondary | ICD-10-CM | POA: Diagnosis not present

## 2020-09-19 NOTE — ED Provider Notes (Signed)
Emergency Medicine Provider Triage Evaluation Note  Carlos Schmidt , a 25 y.o. male  was evaluated in triage.  Pt complains of laceration to the left hand, shortness of breath, and wheezing.  Reports that laceration occurred accidentally this morning at approximately 0 800.  Patient reports that after excellently cutting self he had shortness of breath and wheezing.  Patient reports that he uses inhaler 40-50 times since then.  Patient denies any shortness of breath at present.  Patient is right-hand dominant.  Patient reports last tetanus shot was within 5 years.  Review of Systems  Positive: Laceration, shortness of breath, wheezing Negative: Numbness, weakness, color change  Physical Exam  BP (!) 142/85 (BP Location: Right Arm)   Pulse 87   Temp 98.4 F (36.9 C) (Oral)   Resp 18   Ht 5\' 11"  (1.803 m)   Wt 99.8 kg   SpO2 98%   BMI 30.69 kg/m  Gen:   Awake, no distress   Resp:  Normal effort, lungs clear to auscultation bilaterally.  Patient speaks in focally sentences without difficulty. MSK:   Moves extremities without difficulty; no swelling or tenderness noted to the lower extremities bilaterally Other:  Laceration to left palm, hemorrhage controlled.  Patient has full range of motion to all digits of right hand.  Sensation intact to all digits of right hand.  Medical Decision Making  Medically screening exam initiated at 8:37 PM.  Appropriate orders placed.  Derric Dealmeida Askari was informed that the remainder of the evaluation will be completed by another provider, this initial triage assessment does not replace that evaluation, and the importance of remaining in the ED until their evaluation is complete.     Stephanie Coup 09/19/20 2039    2040, MD 09/20/20 0003

## 2020-09-19 NOTE — ED Triage Notes (Addendum)
Pt c/o shortness of breath after cutting himself on his left hand. Pt denies any breathing problems at this time and only wants to be treated for his hand laceration.

## 2020-09-20 NOTE — ED Notes (Signed)
Patient called for vitals recheck x1 with no response 

## 2021-02-18 ENCOUNTER — Encounter (HOSPITAL_COMMUNITY): Payer: Self-pay

## 2021-02-18 ENCOUNTER — Emergency Department (HOSPITAL_COMMUNITY): Payer: Self-pay

## 2021-02-18 ENCOUNTER — Other Ambulatory Visit: Payer: Self-pay

## 2021-02-18 ENCOUNTER — Emergency Department (HOSPITAL_COMMUNITY)
Admission: EM | Admit: 2021-02-18 | Discharge: 2021-02-18 | Disposition: A | Discharge status: Home / Self Care | Payer: Self-pay | Attending: Emergency Medicine | Admitting: Emergency Medicine

## 2021-02-18 DIAGNOSIS — J45901 Unspecified asthma with (acute) exacerbation: Secondary | ICD-10-CM | POA: Insufficient documentation

## 2021-02-18 DIAGNOSIS — Z20822 Contact with and (suspected) exposure to covid-19: Secondary | ICD-10-CM | POA: Insufficient documentation

## 2021-02-18 LAB — CBC WITH DIFFERENTIAL/PLATELET
Abs Immature Granulocytes: 0.04 10*3/uL (ref 0.00–0.07)
Basophils Absolute: 0 10*3/uL (ref 0.0–0.1)
Basophils Relative: 1 %
Eosinophils Absolute: 1 10*3/uL — ABNORMAL HIGH (ref 0.0–0.5)
Eosinophils Relative: 12 %
HCT: 52.4 % — ABNORMAL HIGH (ref 39.0–52.0)
Hemoglobin: 17.9 g/dL — ABNORMAL HIGH (ref 13.0–17.0)
Immature Granulocytes: 1 %
Lymphocytes Relative: 24 %
Lymphs Abs: 1.9 10*3/uL (ref 0.7–4.0)
MCH: 29.9 pg (ref 26.0–34.0)
MCHC: 34.2 g/dL (ref 30.0–36.0)
MCV: 87.5 fL (ref 80.0–100.0)
Monocytes Absolute: 0.4 10*3/uL (ref 0.1–1.0)
Monocytes Relative: 6 %
Neutro Abs: 4.6 10*3/uL (ref 1.7–7.7)
Neutrophils Relative %: 56 %
Platelets: 217 10*3/uL (ref 150–400)
RBC: 5.99 MIL/uL — ABNORMAL HIGH (ref 4.22–5.81)
RDW: 12.4 % (ref 11.5–15.5)
WBC: 8 10*3/uL (ref 4.0–10.5)
nRBC: 0 % (ref 0.0–0.2)

## 2021-02-18 LAB — BASIC METABOLIC PANEL
Anion gap: 10 (ref 5–15)
BUN: 13 mg/dL (ref 6–20)
CO2: 22 mmol/L (ref 22–32)
Calcium: 9.4 mg/dL (ref 8.9–10.3)
Chloride: 107 mmol/L (ref 98–111)
Creatinine, Ser: 0.92 mg/dL (ref 0.61–1.24)
GFR, Estimated: >60 mL/min (ref >60)
Glucose, Bld: 97 mg/dL (ref 70–99)
Potassium: 4.2 mmol/L (ref 3.5–5.1)
Sodium: 139 mmol/L (ref 135–145)

## 2021-02-18 LAB — RESP PANEL BY RT-PCR (FLU A&B, COVID) ARPGX2
Influenza A by PCR: NEGATIVE
Influenza B by PCR: NEGATIVE
SARS Coronavirus 2 by RT PCR: NEGATIVE

## 2021-02-18 MED ORDER — PREDNISONE 50 MG PO TABS: 50.0000 mg | ORAL_TABLET | Freq: Every day | ORAL | 0 refills | Status: AC

## 2021-02-18 MED ORDER — ALBUTEROL SULFATE (5 MG/ML) 0.5% IN NEBU: 2.5000 mg | INHALATION_SOLUTION | Freq: Four times a day (QID) | RESPIRATORY_TRACT | 1 refills | Status: AC | PRN

## 2021-02-18 MED ORDER — METHYLPREDNISOLONE SODIUM SUCC 125 MG IJ SOLR
125.0000 mg | Freq: Once | INTRAMUSCULAR | Status: AC
  Administered 2021-02-18: 125 mg via INTRAVENOUS
  Filled 2021-02-18: qty 2

## 2021-02-18 MED ORDER — IPRATROPIUM-ALBUTEROL 0.5-2.5 (3) MG/3ML IN SOLN
3.0000 mL | Freq: Once | RESPIRATORY_TRACT | Status: AC
  Administered 2021-02-18: 3 mL via RESPIRATORY_TRACT
  Filled 2021-02-18: qty 3

## 2021-02-18 MED ORDER — ALBUTEROL SULFATE HFA 108 (90 BASE) MCG/ACT IN AERS
2.0000 | INHALATION_SPRAY | Freq: Once | RESPIRATORY_TRACT | Status: AC
  Administered 2021-02-18: 2 via RESPIRATORY_TRACT
  Filled 2021-02-18: qty 6.7

## 2021-02-18 MED ORDER — MAGNESIUM SULFATE 2 GM/50ML IV SOLN
2.0000 g | Freq: Once | INTRAVENOUS | Status: AC
  Administered 2021-02-18: 2 g via INTRAVENOUS
  Filled 2021-02-18: qty 50

## 2021-02-18 MED ORDER — ALBUTEROL SULFATE (2.5 MG/3ML) 0.083% IN NEBU
10.0000 mg/h | INHALATION_SOLUTION | Freq: Once | RESPIRATORY_TRACT | Status: AC
  Administered 2021-02-18: 10 mg/h via RESPIRATORY_TRACT
  Filled 2021-02-18: qty 3

## 2021-02-18 NOTE — ED Notes (Signed)
Shown ekg to tegeler md.

## 2021-02-18 NOTE — ED Triage Notes (Signed)
Pt has been experiencing sob two days unsure what trigger his breathing. Pt states usually around this time of year he has an attack. Pt states his inhalers at home are not working.

## 2021-02-18 NOTE — Discharge Instructions (Signed)
Use nebulizer solution or inhaler every 4-6 hours for the next 24 hours and then as needed.  Take 5 additional days of steroids starting tomorrow morning.  Call to schedule close follow-up with pulmonology.  Return for worsening wheezing or other new or worsening symptoms.

## 2021-02-18 NOTE — ED Provider Notes (Signed)
[facility] Provider Note   CSN: [id number] Arrival date & time: 02/18/21  1009     History  Chief Complaint  Patient presents with   Shortness of Carlos Schmidt a 26 y.o. male.  Carlos Schmidt Schmidt a 26 y.o. male with history of asthma, bipolar disorder and chronic hepatitis C, who presents to the ED for evaluation of shortness of breath, cough and wheezing.  He reports symptoms have been present for 3 days.  He was initially using an albuterol inhaler but then ran out of this and symptoms continued to worsen.  He tried using an over-the-counter epinephrine inhaler but he reports this felt like it was worsening his symptoms.  He denies chest pain but does report some chest tightness associated wheezing.  Reports cough that has been productive.  No fevers or chills.  He reports he tested himself for COVID at home and results were negative but he Schmidt still concerned this could be contributing, he denies any known sick contacts.  He reports that often around this time of year he has an asthma exacerbation but does not currently have a PCP or pulmonologist that he follows with for chronic management of his asthma.  No other aggravating or alleviating factors.  The history Schmidt provided by the patient and medical records.  Shortness of Breath Associated symptoms: cough and wheezing   Associated symptoms: no abdominal pain, no chest pain and no fever       Home Medications Prior to Admission medications   Medication Sig Start Date End Date Taking? Authorizing Provider  albuterol (PROVENTIL) (5 MG/ML) 0.5% nebulizer solution Take 0.5 mLs (2.5 mg total) by nebulization every 6 (six) hours as needed for wheezing or shortness of breath. 02/18/21  Yes Jacqlyn Larsen, PA-C  predniSONE (DELTASONE) 50 MG tablet Take 1 tablet (50 mg total) by mouth daily for 5 days. 02/18/21 02/23/21 Yes Jacqlyn Larsen, PA-C  divalproex (DEPAKOTE) 125 MG DR tablet  Take 125 mg by mouth at bedtime.    [provider]  Glecaprevir-Pibrentasvir (MAVYRET) 100-40 MG TABS Take 3 tablets by mouth daily. Take 3 tablets by mouth daily with a meal. 07/29/19   Golden Circle, FNP  triamcinolone cream (KENALOG) 0.1 % Apply 1 application topically 2 (two) times daily. 08/04/20   Wieters, Hallie C, PA-C      Allergies    Patient has no known allergies.    Review of Systems   Review of Systems  Constitutional:  Negative for chills and fever.  HENT: Negative.    Respiratory:  Positive for cough, chest tightness, shortness of breath and wheezing.   Cardiovascular:  Negative for chest pain.  Gastrointestinal:  Negative for abdominal pain.  Neurological:  Negative for syncope and light-headedness.  All other systems reviewed and are negative.  Physical Exam Updated Vital Signs BP (!) 172/107 (BP Location: Right Arm)    Pulse 85    Temp (!) 97.4 F (36.3 C) (Oral)    Resp 17    Ht 5\' 11"  (1.803 m)    Wt 95.3 kg    SpO2 95%    BMI 29.29 kg/m  Physical Exam Vitals and nursing note reviewed.  Constitutional:      General: He Schmidt not in acute distress.    Appearance: Normal appearance. He Schmidt well-developed. He Schmidt not ill-appearing or diaphoretic.  HENT:     Head: Normocephalic and atraumatic.  Mouth/Throat:     Mouth: Mucous membranes are moist.     Pharynx: Oropharynx Schmidt clear.  Eyes:     General:        Right eye: No discharge.        Left eye: No discharge.  Cardiovascular:     Rate and Rhythm: Normal rate and regular rhythm.     Pulses: Normal pulses.     Heart sounds: Normal heart sounds.  Pulmonary:     Effort: No respiratory distress.     Breath sounds: Decreased breath sounds and wheezing present. No rales.     Comments: Patient Schmidt slightly tachypneic with some increased respiratory effort, able to speak in short sentences, maintaining sats on room air but on auscultation patient has diffuse coarse expiratory wheezing and decreased air  movement. Chest:     Chest wall: No tenderness.  Abdominal:     General: Bowel sounds are normal. There Schmidt no distension.     Palpations: Abdomen Schmidt soft. There Schmidt no mass.     Tenderness: There Schmidt no abdominal tenderness. There Schmidt no guarding.     Comments: Abdomen soft, nondistended, nontender to palpation in all quadrants without guarding or peritoneal signs  Musculoskeletal:        General: No deformity.     Cervical back: Neck supple.     Right lower leg: No tenderness. No edema.     Left lower leg: No tenderness. No edema.  Skin:    General: Skin Schmidt warm and dry.     Capillary Refill: Capillary refill takes less than 2 seconds.  Neurological:     Mental Status: He Schmidt alert and oriented to person, place, and time.     Coordination: Coordination normal.     Comments: Speech Schmidt clear, able to follow commands Moves extremities without ataxia, coordination intact  Psychiatric:        Mood and Affect: Mood normal.        Behavior: Behavior normal.    ED Results / Procedures / Treatments   Labs (all labs ordered are listed, but only abnormal results are displayed) Labs Reviewed  CBC WITH DIFFERENTIAL/PLATELET - Abnormal; Notable for the following components:      Result Value   RBC 5.99 (*)    Hemoglobin 17.9 (*)    HCT 52.4 (*)    Eosinophils Absolute 1.0 (*)    All other components within normal limits  RESP PANEL BY RT-PCR (FLU A&B, COVID) ARPGX2  BASIC METABOLIC PANEL    EKG None  Radiology DG Chest Port 1 View  Result Date: 02/18/2021 CLINICAL DATA:  Shortness of breath and productive cough. EXAM: PORTABLE CHEST 1 VIEW COMPARISON:  03/03/2019 FINDINGS: 1100 hours. The lungs are clear without focal pneumonia, edema, pneumothorax or pleural effusion. The cardiopericardial silhouette Schmidt within normal limits for size. Telemetry leads overlie the chest. IMPRESSION: No active disease. Electronically Signed   By: Misty Stanley M.D.   On: 02/18/2021 11:17     Procedures .Critical Care Performed by: Jacqlyn Larsen, PA-C Authorized by: Jacqlyn Larsen, PA-C   Critical care provider statement:    Critical care time (minutes):  60   Critical care was necessary to treat or prevent imminent or life-threatening deterioration of the following conditions:  Respiratory failure (Asthma exacerbation)   Critical care was time spent personally by me on the following activities:  Development of treatment plan with patient or surrogate, evaluation of patient's response to treatment, examination of patient,  obtaining history from patient or surrogate, ordering and performing treatments and interventions, ordering and review of laboratory studies, ordering and review of radiographic studies, pulse oximetry, re-evaluation of patient's condition and review of old charts    Medications Ordered in ED Medications  ipratropium-albuterol (DUONEB) 0.5-2.5 (3) MG/3ML nebulizer solution 3 mL (3 mLs Nebulization Given 02/18/21 1113)  methylPREDNISolone sodium succinate (SOLU-MEDROL) 125 mg/2 mL injection 125 mg (125 mg Intravenous Given 02/18/21 1109)  magnesium sulfate IVPB 2 g 50 mL (0 g Intravenous Stopped 02/18/21 1219)  ipratropium-albuterol (DUONEB) 0.5-2.5 (3) MG/3ML nebulizer solution 3 mL (3 mLs Nebulization Given 02/18/21 1211)  albuterol (PROVENTIL) (2.5 MG/3ML) 0.083% nebulizer solution (10 mg/hr Nebulization Given 02/18/21 1321)  albuterol (VENTOLIN HFA) 108 (90 Base) MCG/ACT inhaler 2 puff (2 puffs Inhalation Given 02/18/21 1455)    ED Course/ Medical Decision Making/ A&P                            This patient presents to the ED for concern of shortness of breath, wheezing, cough, this involves an extensive number of treatment options, and Schmidt a complaint that carries with it a high risk of complications and morbidity.  The differential diagnosis includes asthma exacerbation, pneumonia, COVID, flu, less concerning for ACS, CHF, PE given patient's history.   Co  morbidities that complicate the patient evaluation  Asthma, chronic hepatitis C, bipolar disorder   Additional history obtained:  Additional history obtained from chart review External records from outside source obtained and reviewed including prior ED visits and outpatient infectious disease visits   Lab Tests:  I Ordered, and personally interpreted labs.  The pertinent results include: No leukocytosis, hemoglobin elevated, no significant electrolyte derangements, normal renal function, negative COVID and flu panel   Imaging Studies ordered:  I ordered imaging studies including chest x-ray I independently visualized and interpreted imaging which showed no pneumonia or other active cardiopulmonary disease I agree with the radiologist interpretation   Cardiac Monitoring:  The patient was maintained on a cardiac monitor.  I personally viewed and interpreted the cardiac monitored which showed an underlying rhythm of: Normal sinus rhythm   Medicines ordered and prescription drug management:  I ordered medication including steroids and multiple nebulizer treatments for asthma exacerbation as well as IV magnesium  After initial treatment patient had some increased air movement but was still having diffuse expiratory wheezing, ordered an additional DuoNeb After second nebulizer patient continuing to improve but continues to have diffuse wheezing, will give 1 hour continuous nebulizer treatment.    Problem List / ED Course:  Acute asthma exacerbation, after 3 nebulizer treatments patient has significantly with only faint expiratory wheezing and significantly improved air movement.  Patient responded well to nebulizers, steroids and magnesium and the rest of his work-up has been reassuring does not show signs of underlying infection and I doubt cardiac source for PE contributing to shortness of breath. Patient maintained normal O2 sats   Reevaluation:  After the interventions  noted above, I reevaluated the patient and found that they have :improved   Social Determinants of Health:  Currently uninsured but has recently gained employment and will have insurance starting next week, currently does not have a PCP or pulmonologist that he follows with for chronic management of his asthma   Dispostion:  After consideration of the diagnostic results and the patients response to treatment, I considered admission for asthma exacerbation but given patient's continued improvement after third nebulizer treatment  feel he Schmidt appropriate for discharge home with 5-day steroid burst and continued nebulizer treatment, he has been provided an inhaler here in the ED and also given a prescription for nebulizer solution to continue to use as needed at home.  I have also given patient information for follow-up with pulmonology clinic.  He expresses understanding and agreement with plan.  Discharged home in good condition.         Final Clinical Impression(s) / ED Diagnoses Final diagnoses:  Exacerbation of asthma, unspecified asthma severity, unspecified whether persistent    Rx / DC Orders ED Discharge Orders          Ordered    predniSONE (DELTASONE) 50 MG tablet  Daily        02/18/21 1447    albuterol (PROVENTIL) (5 MG/ML) 0.5% nebulizer solution  Every 6 hours PRN        02/18/21 1447              Jacqlyn Larsen, [place] 02/18/21 1511    Tegeler, Gwenyth Allegra, MD 02/18/21 [phone]

## 2023-05-01 IMAGING — DX DG CHEST 1V PORT
1 series · 2 of 2 positions shown · non-contrast
Comparison: 03/03/2019

CLINICAL DATA: Shortness of breath and productive cough.

EXAM:
PORTABLE CHEST 1 VIEW

[Series 1: chest · 0.14mm/px · 2 of 2 slices shown]
[im 1/2]
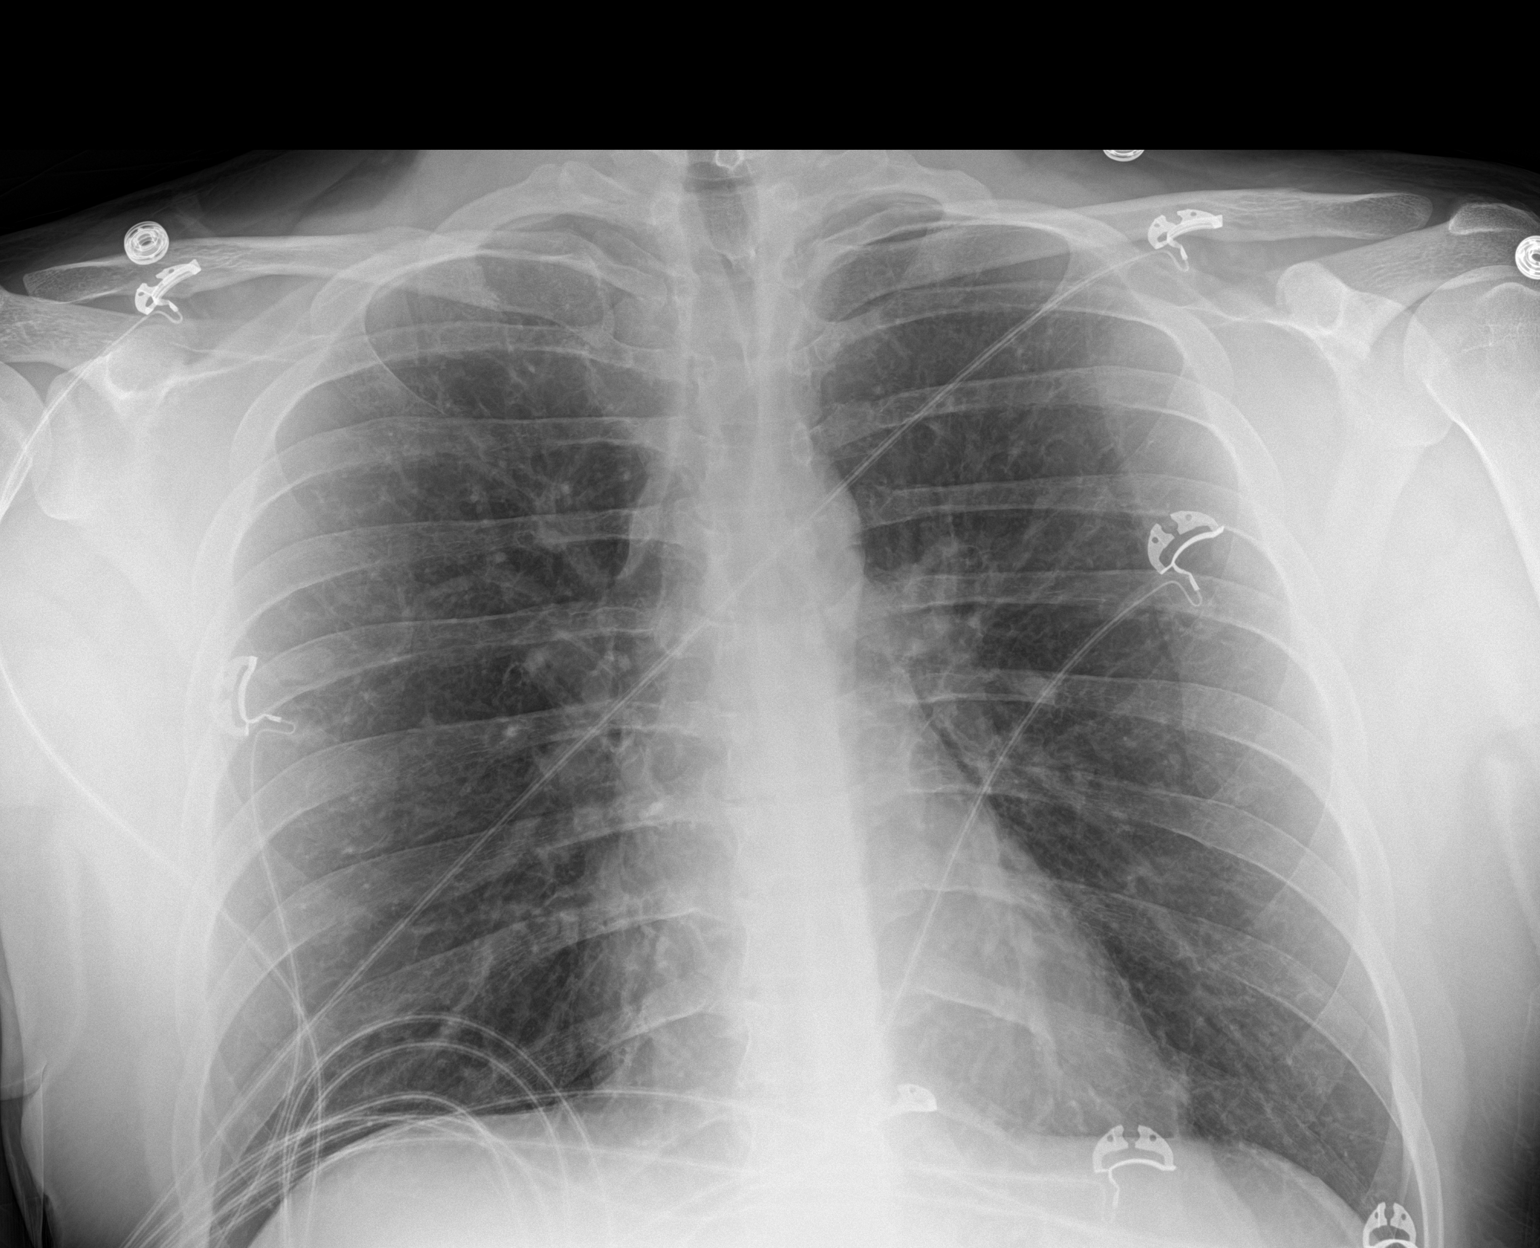
[im 2/2]
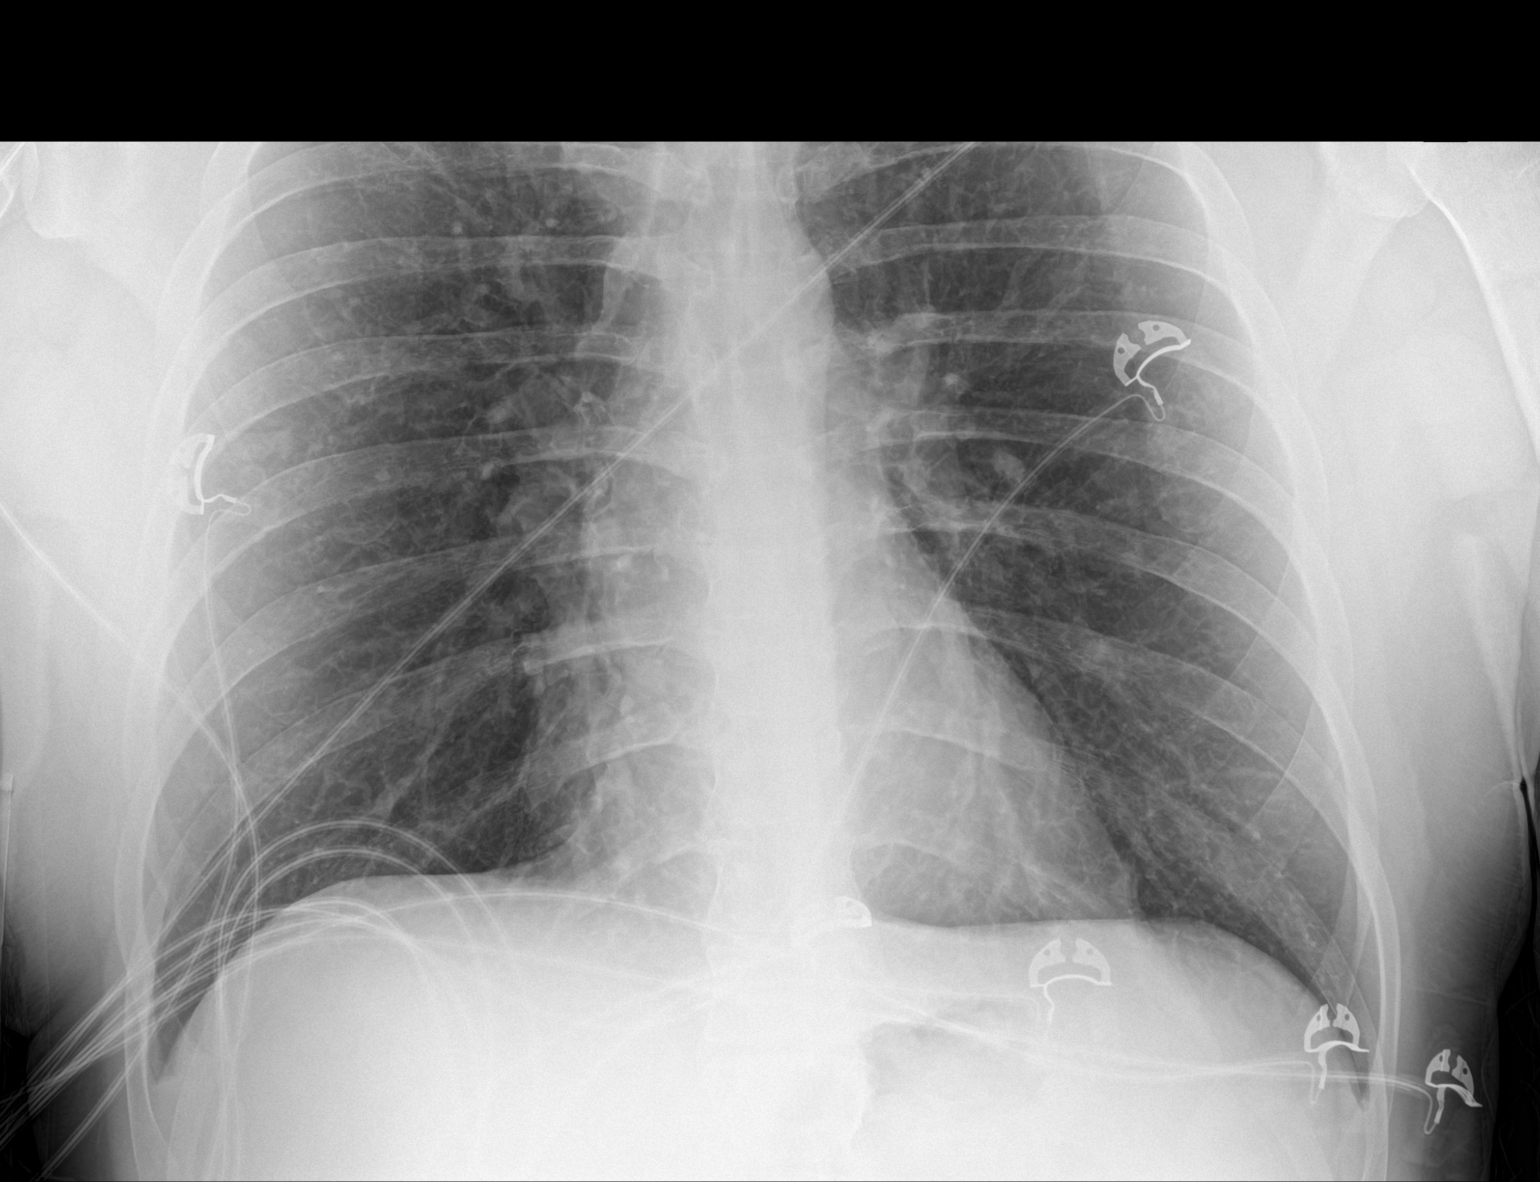

[2 of 2 positions shown; findings below may reference images not displayed]

FINDINGS: 3366 hours. The lungs are clear without focal pneumonia, edema,
pneumothorax or pleural effusion. The cardiopericardial silhouette
is within normal limits for size. Telemetry leads overlie the chest.
IMPRESSION: No active disease.
